# Patient Record
Sex: Female | Born: 1995 | Hispanic: No | Marital: Single | Smoking: Never smoker
Health system: Southern US, Community
[De-identification: ages and names within clinical notes are randomized; demographics above are authoritative.]

## PROBLEM LIST (undated history)

## (undated) DIAGNOSIS — N83209 Unspecified ovarian cyst, unspecified side: Secondary | ICD-10-CM

## (undated) HISTORY — PX: INDUCED ABORTION: SHX677

---

## 2017-11-28 ENCOUNTER — Encounter: Payer: Self-pay | Admitting: Emergency Medicine

## 2017-11-28 ENCOUNTER — Emergency Department: Payer: Medicaid - Out of State

## 2017-11-28 ENCOUNTER — Other Ambulatory Visit: Payer: Self-pay

## 2017-11-28 ENCOUNTER — Emergency Department
Admission: EM | Admit: 2017-11-28 | Discharge: 2017-11-28 | Disposition: A | Discharge status: Home / Self Care | Payer: Medicaid - Out of State | Attending: Emergency Medicine | Admitting: Emergency Medicine

## 2017-11-28 DIAGNOSIS — N83291 Other ovarian cyst, right side: Secondary | ICD-10-CM | POA: Diagnosis not present

## 2017-11-28 DIAGNOSIS — N941 Unspecified dyspareunia: Secondary | ICD-10-CM

## 2017-11-28 DIAGNOSIS — R1032 Left lower quadrant pain: Secondary | ICD-10-CM | POA: Diagnosis present

## 2017-11-28 DIAGNOSIS — N83201 Unspecified ovarian cyst, right side: Secondary | ICD-10-CM

## 2017-11-28 DIAGNOSIS — N9419 Other specified dyspareunia: Secondary | ICD-10-CM | POA: Diagnosis not present

## 2017-11-28 DIAGNOSIS — R102 Pelvic and perineal pain: Secondary | ICD-10-CM | POA: Diagnosis not present

## 2017-11-28 LAB — BASIC METABOLIC PANEL
ANION GAP: 4 — AB (ref 5–15)
BUN: 14 mg/dL (ref 6–20)
CALCIUM: 8.7 mg/dL — AB (ref 8.9–10.3)
CO2: 26 mmol/L (ref 22–32)
CREATININE: 0.6 mg/dL (ref 0.44–1.00)
Chloride: 107 mmol/L (ref 101–111)
GFR calc Af Amer: >60 mL/min (ref >60)
GLUCOSE: 91 mg/dL (ref 65–99)
Potassium: 3.7 mmol/L (ref 3.5–5.1)
Sodium: 137 mmol/L (ref 135–145)

## 2017-11-28 LAB — URINALYSIS, COMPLETE (UACMP) WITH MICROSCOPIC
Bacteria, UA: NONE SEEN
Bilirubin Urine: NEGATIVE
GLUCOSE, UA: NEGATIVE mg/dL
HGB URINE DIPSTICK: NEGATIVE
Ketones, ur: NEGATIVE mg/dL
Leukocytes, UA: NEGATIVE
NITRITE: NEGATIVE
PH: 7 (ref 5.0–8.0)
Protein, ur: NEGATIVE mg/dL
SPECIFIC GRAVITY, URINE: 1.019 (ref 1.005–1.030)

## 2017-11-28 LAB — CBC
HCT: 37.3 % (ref 35.0–47.0)
HEMOGLOBIN: 12.8 g/dL (ref 12.0–16.0)
MCH: 31.5 pg (ref 26.0–34.0)
MCHC: 34.4 g/dL (ref 32.0–36.0)
MCV: 91.4 fL (ref 80.0–100.0)
PLATELETS: 255 10*3/uL (ref 150–440)
RBC: 4.08 MIL/uL (ref 3.80–5.20)
RDW: 13 % (ref 11.5–14.5)
WBC: 6.1 10*3/uL (ref 3.6–11.0)

## 2017-11-28 LAB — POCT PREGNANCY, URINE: Preg Test, Ur: NEGATIVE

## 2017-11-28 MED ORDER — KETOROLAC TROMETHAMINE 10 MG PO TABS
10.0000 mg | ORAL_TABLET | Freq: Once | ORAL | Status: AC
  Administered 2017-11-28: 10 mg via ORAL
  Filled 2017-11-28: qty 1

## 2017-11-28 MED ORDER — IBUPROFEN 800 MG PO TABS: 800.0000 mg | ORAL_TABLET | Freq: Three times a day (TID) | ORAL | 0 refills | Status: DC | PRN

## 2017-11-28 NOTE — ED Triage Notes (Signed)
Here for LLQ pain on and off X 1 year.  Pt reports her doctor before told her because her ex had a large penis but having pain with periods and intermittent without bleeding as well.  VSS. NAD

## 2017-11-28 NOTE — ED Provider Notes (Signed)
[facility] Emergency Department Provider Note  ____________________________________________  Time seen: Approximately 3:20 PM  I have reviewed the triage vital signs and the nursing notes.   HISTORY  Chief Complaint Abdominal Pain    HPI Dorothy Ingram is a 22 y.o. female who complains of left lower quadrant abdominal pain on and off for the past year. Worse after sexual intercourse. She's had multiple evaluations for this, but no recent imaging. She went to the health department yesterday where she had a pelvic exam and STI evaluation completed. She has no concern for STI at this time. However, this chronic recurrent pain has been worse for the past 4 days, started after sexual intercourse, constant, waxing and waning, severe, no aggravating or alleviating factors.  denies unusual vaginal bleeding or discharge.    History reviewed. No pertinent past medical history.   There are no active problems to display for this patient.    Past Surgical History:  Procedure Laterality Date  . INDUCED ABORTION       Prior to Admission medications   Medication Sig Start Date End Date Taking? Authorizing Provider  ibuprofen (ADVIL,MOTRIN) 800 MG tablet Take 1 tablet (800 mg total) by mouth every 8 (eight) hours as needed. 11/28/17   Sharman CheekStafford, Kristle Wesch, MD  none   Allergies Codeine and Latex   History reviewed. No pertinent family history.  Social History Social History   Tobacco Use  . Smoking status: Never Smoker  . Smokeless tobacco: Never Used  Substance Use Topics  . Alcohol use: Never    Frequency: Never  . Drug use: Never    Review of Systems  Constitutional:   No fever or chills.   Cardiovascular:   No chest pain or syncope. Respiratory:   No dyspnea or cough. Gastrointestinal:  positive as above for abdominal pain without vomiting and diarrhea.  Musculoskeletal:   Negative for focal pain or swelling. No joint pains All other  systems reviewed and are negative except as documented above in ROS and HPI.  ____________________________________________   PHYSICAL EXAM:  VITAL SIGNS: ED Triage Vitals  Enc Vitals Group     BP 11/28/17 1043 121/62     Pulse Rate 11/28/17 1043 (!) 59     Resp 11/28/17 1043 16     Temp 11/28/17 1043 99.6 F (37.6 C)     Temp Source 11/28/17 1043 Oral     SpO2 11/28/17 1043 95 %     Weight 11/28/17 1045 144 lb (65.3 kg)     Height 11/28/17 1045 5\' 5"  (1.651 m)     Head Circumference --      Peak Flow --      Pain Score 11/28/17 1055 5     Pain Loc --      Pain Edu? --      Excl. in GC? --     Vital signs reviewed, nursing assessments reviewed.   Constitutional:   Alert and oriented. Well appearing and in no distress. Eyes:   Conjunctivae are normal. EOMI.  ENT      Head:   Normocephalic and atraumatic.      Nose:   No congestion/rhinnorhea.       Mouth/Throat:   MMM, no pharyngeal erythema. No peritonsillar mass.       Neck:   No meningismus. Full ROM. Hematological/Lymphatic/Immunilogical:   No cervical lymphadenopathy. Cardiovascular:   RRR. Symmetric bilateral radial and DP pulses.  No murmurs.  Respiratory:   Normal respiratory effort without tachypnea/retractions.  Breath sounds are clear and equal bilaterally. No wheezes/rales/rhonchi. Gastrointestinal:   Soft with suprapubic and left lower quadrant tenderness. Non distended. There is no CVA tenderness.  No rebound, rigidity, or guarding. Genitourinary:   deferred Musculoskeletal:   Normal range of motion in all extremities. No joint effusions.  No lower extremity tenderness.  No edema. Neurologic:   Normal speech and language.  Motor grossly intact. No acute focal neurologic deficits are appreciated.    ____________________________________________    LABS (pertinent positives/negatives) (all labs ordered are listed, but only abnormal results are displayed) Labs Reviewed  BASIC METABOLIC PANEL - Abnormal;  Notable for the following components:      Result Value   Calcium 8.7 (*)    Anion gap 4 (*)    All other components within normal limits  URINALYSIS, COMPLETE (UACMP) WITH MICROSCOPIC - Abnormal; Notable for the following components:   Color, Urine YELLOW (*)    APPearance CLEAR (*)    Squamous Epithelial / LPF 0-5 (*)    All other components within normal limits  CBC  POCT PREGNANCY, URINE  POC URINE PREG, ED   ____________________________________________   EKG    ____________________________________________    RADIOLOGY  US Transvaginal Non-ob  Result Date: 11/28/2017 CLINICAL DATA:  Intermittent left lower quadrant pain for 1 year EXAM: TRANSABDOMINAL AND TRANSVAGINAL ULTRASOUND OF PELVIS DOPPLER ULTRASOUND OF OVARIES TECHNIQUE: Both transabdominal and transvaginal ultrasound examinations of the pelvis were performed. Transabdominal technique was performed for global imaging of the pelvis including uterus, ovaries, adnexal regions, and pelvic cul-de-sac. It was necessary to proceed with endovaginal exam following the transabdominal exam to visualize the endometrium and ovaries. Color and duplex Doppler ultrasound was utilized to evaluate blood flow to the ovaries. COMPARISON:  None. FINDINGS: Uterus Measurements: 8.9 x 4.2 x 5.5 cm. No fibroids or other mass visualized. Endometrium Thickness: 3 mm.  No focal abnormality visualized. Right ovary Measurements: 4.9 x 2.5 x 3.8 cm.  Cyst measuring 3.6 x 2 x 3.1 cm Left ovary Measurements: 3.4 x 2.1 x 3 cm.  Normal appearance/no adnexal mass. Pulsed Doppler evaluation of both ovaries demonstrates normal low-resistance arterial and venous waveforms. Other findings Small free fluid in the pelvis. IMPRESSION: 1. Negative for ovarian torsion 2. 3.6 cm right ovarian cyst. This is almost certainly benign, and no specific imaging follow up is recommended according to the Society of Radiologists in Ultrasound2010 Consensus Conference Statement (D  Lenis Noon et al. Management of Asymptomatic Ovarian and Other Adnexal Cysts Imaged at [place]: Society of Radiologists in Ultrasound Consensus Conference Statement 2010. Radiology 256 (Sept 2010): 943-954.). 3. Small amount of free fluid in the pelvis. Electronically Signed   By: Jasmine Pang M.D.   On: 11/28/2017 16:32   US Pelvis Complete  Result Date: 11/28/2017 CLINICAL DATA:  Intermittent left lower quadrant pain for 1 year EXAM: TRANSABDOMINAL AND TRANSVAGINAL ULTRASOUND OF PELVIS DOPPLER ULTRASOUND OF OVARIES TECHNIQUE: Both transabdominal and transvaginal ultrasound examinations of the pelvis were performed. Transabdominal technique was performed for global imaging of the pelvis including uterus, ovaries, adnexal regions, and pelvic cul-de-sac. It was necessary to proceed with endovaginal exam following the transabdominal exam to visualize the endometrium and ovaries. Color and duplex Doppler ultrasound was utilized to evaluate blood flow to the ovaries. COMPARISON:  None. FINDINGS: Uterus Measurements: 8.9 x 4.2 x 5.5 cm. No fibroids or other mass visualized. Endometrium Thickness: 3 mm.  No focal abnormality visualized. Right ovary Measurements: 4.9 x 2.5 x 3.8 cm.  Cyst measuring  3.6 x 2 x 3.1 cm Left ovary Measurements: 3.4 x 2.1 x 3 cm.  Normal appearance/no adnexal mass. Pulsed Doppler evaluation of both ovaries demonstrates normal low-resistance arterial and venous waveforms. Other findings Small free fluid in the pelvis. IMPRESSION: 1. Negative for ovarian torsion 2. 3.6 cm right ovarian cyst. This is almost certainly benign, and no specific imaging follow up is recommended according to the Society of Radiologists in Ultrasound2010 Consensus Conference Statement (D Lenis Noon et al. Management of Asymptomatic Ovarian and Other Adnexal Cysts Imaged at [place]: Society of Radiologists in Ultrasound Consensus Conference Statement 2010. Radiology 256 (Sept 2010): [phone].). 3. Small amount of free fluid in the  pelvis. Electronically Signed   By: Jasmine Pang M.D.   On: 11/28/2017 16:32   [place] Art/ven Flow Abd Pelv Doppler  Result Date: 11/28/2017 CLINICAL DATA:  Intermittent left lower quadrant pain for 1 year EXAM: TRANSABDOMINAL AND TRANSVAGINAL ULTRASOUND OF PELVIS DOPPLER ULTRASOUND OF OVARIES TECHNIQUE: Both transabdominal and transvaginal ultrasound examinations of the pelvis were performed. Transabdominal technique was performed for global imaging of the pelvis including uterus, ovaries, adnexal regions, and pelvic cul-de-sac. It was necessary to proceed with endovaginal exam following the transabdominal exam to visualize the endometrium and ovaries. Color and duplex Doppler ultrasound was utilized to evaluate blood flow to the ovaries. COMPARISON:  None. FINDINGS: Uterus Measurements: 8.9 x 4.2 x 5.5 cm. No fibroids or other mass visualized. Endometrium Thickness: 3 mm.  No focal abnormality visualized. Right ovary Measurements: 4.9 x 2.5 x 3.8 cm.  Cyst measuring 3.6 x 2 x 3.1 cm Left ovary Measurements: 3.4 x 2.1 x 3 cm.  Normal appearance/no adnexal mass. Pulsed Doppler evaluation of both ovaries demonstrates normal low-resistance arterial and venous waveforms. Other findings Small free fluid in the pelvis. IMPRESSION: 1. Negative for ovarian torsion 2. 3.6 cm right ovarian cyst. This is almost certainly benign, and no specific imaging follow up is recommended according to the Society of Radiologists in Ultrasound2010 Consensus Conference Statement (D Lenis Noon et al. Management of Asymptomatic Ovarian and Other Adnexal Cysts Imaged at [place]: Society of Radiologists in Ultrasound Consensus Conference Statement 2010. Radiology 256 (Sept 2010): [phone].). 3. Small amount of free fluid in the pelvis. Electronically Signed   By: Jasmine Pang M.D.   On: 11/28/2017 16:32    ____________________________________________   PROCEDURES Procedures  ____________________________________________  DIFFERENTIAL  DIAGNOSIS   ovarian cysts, ovarian torsion, TOA, endometriosis, dyspareunia  CLINICAL IMPRESSION / ASSESSMENT AND PLAN / ED COURSE  Pertinent labs & imaging results that were available during my care of the patient were reviewed by me and considered in my medical decision making (see chart for details).    patient well appearing no acute distress normal vital signs, calm and comfortable and pleasantly interactive. Has recurrent chronic abdominal pain that is more severe than usual. I'll get an ultrasound to evaluate for ovarian cysts and complications thereof versus TOA. If negative and patient can continue to follow up with gynecology outpatient.  Clinical Course as of Nov 29 1707  Thu Nov 28, 2017  1653 US pelvis unremarkable except 3 cm R ovarian cyst. No torsion, no TOA. Plan for outpatient follow up.    [PS]    Clinical Course User Index [PS] Sharman Cheek, MD    ----------------------------------------- 5:09 PM on 11/28/2017 -----------------------------------------  Patient calm and comfortable. Ultrasound negative except for incidental right ovarian cyst which is small. Patient informed of findings. Follow-up plan for gynecology. Referral information provided.purple but provided.  ____________________________________________   FINAL CLINICAL IMPRESSION(S) / ED DIAGNOSES    Final diagnoses:  Pelvic pain in female  Dyspareunia, female  Right ovarian cyst     ED Discharge Orders        Ordered    ibuprofen (ADVIL,MOTRIN) 800 MG tablet  Every 8 hours PRN     11/28/17 1708      Portions of this note were generated with dragon dictation software. Dictation errors may occur despite best attempts at proofreading.    Sharman Cheek, MD 11/28/17 [phone]

## 2017-11-28 NOTE — ED Notes (Addendum)
Pt ambulatory to POV. VSS. NAD. Discharge instructions reviewed.

## 2018-01-24 ENCOUNTER — Other Ambulatory Visit: Payer: Self-pay

## 2018-01-24 ENCOUNTER — Encounter (HOSPITAL_BASED_OUTPATIENT_CLINIC_OR_DEPARTMENT_OTHER): Payer: Self-pay | Admitting: *Deleted

## 2018-01-24 ENCOUNTER — Emergency Department (HOSPITAL_BASED_OUTPATIENT_CLINIC_OR_DEPARTMENT_OTHER): Payer: No Typology Code available for payment source

## 2018-01-24 ENCOUNTER — Emergency Department (HOSPITAL_BASED_OUTPATIENT_CLINIC_OR_DEPARTMENT_OTHER)
Admission: EM | Admit: 2018-01-24 | Discharge: 2018-01-24 | Disposition: A | Discharge status: Home / Self Care | Payer: No Typology Code available for payment source | Attending: Emergency Medicine | Admitting: Emergency Medicine

## 2018-01-24 DIAGNOSIS — S299XXA Unspecified injury of thorax, initial encounter: Secondary | ICD-10-CM | POA: Diagnosis not present

## 2018-01-24 DIAGNOSIS — Y999 Unspecified external cause status: Secondary | ICD-10-CM | POA: Insufficient documentation

## 2018-01-24 DIAGNOSIS — S199XXA Unspecified injury of neck, initial encounter: Secondary | ICD-10-CM | POA: Diagnosis not present

## 2018-01-24 DIAGNOSIS — S3991XA Unspecified injury of abdomen, initial encounter: Secondary | ICD-10-CM | POA: Insufficient documentation

## 2018-01-24 DIAGNOSIS — Y9389 Activity, other specified: Secondary | ICD-10-CM | POA: Diagnosis not present

## 2018-01-24 DIAGNOSIS — Y92410 Unspecified street and highway as the place of occurrence of the external cause: Secondary | ICD-10-CM | POA: Diagnosis not present

## 2018-01-24 DIAGNOSIS — S0990XA Unspecified injury of head, initial encounter: Secondary | ICD-10-CM | POA: Diagnosis present

## 2018-01-24 HISTORY — DX: Unspecified ovarian cyst, unspecified side: N83.209

## 2018-01-24 LAB — CBC WITH DIFFERENTIAL/PLATELET
BASOS ABS: 0 10*3/uL (ref 0.0–0.1)
Basophils Relative: 0 %
EOS ABS: 0 10*3/uL (ref 0.0–0.7)
Eosinophils Relative: 1 %
HCT: 36.9 % (ref 36.0–46.0)
Hemoglobin: 12.9 g/dL (ref 12.0–15.0)
LYMPHS ABS: 2.4 10*3/uL (ref 0.7–4.0)
LYMPHS PCT: 28 %
MCH: 31.7 pg (ref 26.0–34.0)
MCHC: 35 g/dL (ref 30.0–36.0)
MCV: 90.7 fL (ref 78.0–100.0)
MONO ABS: 0.7 10*3/uL (ref 0.1–1.0)
Monocytes Relative: 9 %
Neutro Abs: 5.3 10*3/uL (ref 1.7–7.7)
Neutrophils Relative %: 62 %
Platelets: 283 10*3/uL (ref 150–400)
RBC: 4.07 MIL/uL (ref 3.87–5.11)
RDW: 11.7 % (ref 11.5–15.5)
WBC: 8.5 10*3/uL (ref 4.0–10.5)

## 2018-01-24 LAB — PREGNANCY, URINE: PREG TEST UR: NEGATIVE

## 2018-01-24 LAB — BASIC METABOLIC PANEL
Anion gap: 7 (ref 5–15)
BUN: 15 mg/dL (ref 6–20)
CALCIUM: 9.1 mg/dL (ref 8.9–10.3)
CO2: 26 mmol/L (ref 22–32)
CREATININE: 0.63 mg/dL (ref 0.44–1.00)
Chloride: 106 mmol/L (ref 101–111)
GFR calc Af Amer: >60 mL/min (ref >60)
GLUCOSE: 90 mg/dL (ref 65–99)
Potassium: 3.5 mmol/L (ref 3.5–5.1)
Sodium: 139 mmol/L (ref 135–145)

## 2018-01-24 MED ORDER — IOPAMIDOL (ISOVUE-300) INJECTION 61%
100.0000 mL | Freq: Once | INTRAVENOUS | Status: AC | PRN
  Administered 2018-01-24: 100 mL via INTRAVENOUS

## 2018-01-24 NOTE — ED Notes (Signed)
Ct waiting for urine pregnancy to result prior to imaging abd/pelvis with iv contrast per radiology protocol
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
## 2018-03-07 ENCOUNTER — Emergency Department (HOSPITAL_COMMUNITY)
Admission: EM | Admit: 2018-03-07 | Discharge: 2018-03-07 | Disposition: A | Discharge status: Home / Self Care | Payer: Self-pay | Attending: Emergency Medicine | Admitting: Emergency Medicine

## 2018-03-07 ENCOUNTER — Other Ambulatory Visit: Payer: Self-pay

## 2018-03-07 ENCOUNTER — Emergency Department (HOSPITAL_COMMUNITY): Payer: Self-pay

## 2018-03-07 ENCOUNTER — Encounter (HOSPITAL_COMMUNITY): Payer: Self-pay | Admitting: *Deleted

## 2018-03-07 DIAGNOSIS — N83299 Other ovarian cyst, unspecified side: Secondary | ICD-10-CM

## 2018-03-07 DIAGNOSIS — Z91040 Latex allergy status: Secondary | ICD-10-CM | POA: Insufficient documentation

## 2018-03-07 DIAGNOSIS — R102 Pelvic and perineal pain: Secondary | ICD-10-CM | POA: Insufficient documentation

## 2018-03-07 DIAGNOSIS — N83209 Unspecified ovarian cyst, unspecified side: Secondary | ICD-10-CM

## 2018-03-07 DIAGNOSIS — N83201 Unspecified ovarian cyst, right side: Secondary | ICD-10-CM | POA: Insufficient documentation

## 2018-03-07 LAB — COMPREHENSIVE METABOLIC PANEL
ALT: 10 U/L (ref 0–44)
AST: 15 U/L (ref 15–41)
Albumin: 4.2 g/dL (ref 3.5–5.0)
Alkaline Phosphatase: 35 U/L — ABNORMAL LOW (ref 38–126)
Anion gap: 6 (ref 5–15)
BUN: 13 mg/dL (ref 6–20)
CO2: 26 mmol/L (ref 22–32)
Calcium: 9.4 mg/dL (ref 8.9–10.3)
Chloride: 108 mmol/L (ref 98–111)
Creatinine, Ser: 0.63 mg/dL (ref 0.44–1.00)
GFR calc Af Amer: >60 mL/min (ref >60)
GFR calc non Af Amer: >60 mL/min (ref >60)
GLUCOSE: 87 mg/dL (ref 70–99)
POTASSIUM: 3.8 mmol/L (ref 3.5–5.1)
SODIUM: 140 mmol/L (ref 135–145)
TOTAL PROTEIN: 6.8 g/dL (ref 6.5–8.1)
Total Bilirubin: 0.7 mg/dL (ref 0.3–1.2)

## 2018-03-07 LAB — CBC
HEMATOCRIT: 38.7 % (ref 36.0–46.0)
Hemoglobin: 12.7 g/dL (ref 12.0–15.0)
MCH: 30.7 pg (ref 26.0–34.0)
MCHC: 32.8 g/dL (ref 30.0–36.0)
MCV: 93.5 fL (ref 78.0–100.0)
Platelets: 265 10*3/uL (ref 150–400)
RBC: 4.14 MIL/uL (ref 3.87–5.11)
RDW: 11.7 % (ref 11.5–15.5)
WBC: 9 10*3/uL (ref 4.0–10.5)

## 2018-03-07 LAB — URINALYSIS, ROUTINE W REFLEX MICROSCOPIC
BILIRUBIN URINE: NEGATIVE
GLUCOSE, UA: NEGATIVE mg/dL
HGB URINE DIPSTICK: NEGATIVE
KETONES UR: NEGATIVE mg/dL
Leukocytes, UA: NEGATIVE
NITRITE: NEGATIVE
Protein, ur: NEGATIVE mg/dL
Specific Gravity, Urine: 1.014 (ref 1.005–1.030)
pH: 6 (ref 5.0–8.0)

## 2018-03-07 LAB — I-STAT BETA HCG BLOOD, ED (MC, WL, AP ONLY): I-stat hCG, quantitative: <5 m[IU]/mL (ref <5)

## 2018-03-07 LAB — LIPASE, BLOOD: LIPASE: 41 U/L (ref 11–51)

## 2018-03-07 LAB — WET PREP, GENITAL
Clue Cells Wet Prep HPF POC: NONE SEEN
SPERM: NONE SEEN
Trich, Wet Prep: NONE SEEN
Yeast Wet Prep HPF POC: NONE SEEN

## 2018-03-07 NOTE — ED Triage Notes (Signed)
PT reports  RLQ ABD pain . Pt reports a previous Ovarian  Cyst . Pt reports this pain feels like  The cyst pain . Pt also is not using  Birth Control.

## 2018-03-07 NOTE — Care Management (Signed)
ED CM received consult from M. McDonald PA-C on Pod E concerning patient needling close follow up. Patient does not have a PCP or health insurance. Patient can contact [facility] on Monday 7/22 to schedule a follow up appointment. CM notified clinic CM concerning patient. Updated EDP, who is agreeable with transitional care plan. No further ED CM needs.

## 2018-03-07 NOTE — ED Notes (Signed)
Patient verbalizes understanding of discharge instructions. Opportunity for questioning and answers were provided. Armband removed by staff, pt discharged from ED.  

## 2018-03-07 NOTE — Discharge Instructions (Signed)
Thank you for allowing me to care for you today in the Emergency Department.   Your ultrasound today showed a complex cyst to the right ovary.  The radiologist recommended a repeat ultrasound in 6 to 12 weeks.  They recommended following a "normal menstrual cycle", but as we discussed, you have not been having regular periods. If you are able to follow up with an OB/GYN, they may be able to guide you as to when you should get the ultrasound completed since her periods have been irregular.  I will place a call with case management and have them follow up with you to see if they can assist you with changing your Medicaid.  You can also follow-up with your case manager work.  You can take your home ibuprofen if your pain worsens.  I suspect her pain was worse today because 1 of the smaller follicles may have ruptured.  Return to the emergency department if you develop fevers, chills, vaginal bleeding with severe right-sided pain that is heavy, shortness of breath, fatigue, change in the vaginal discharge that you have been having, or other new, concerning symptoms.
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
## 2018-03-07 NOTE — ED Notes (Signed)
Patient transported to Ultrasound 

## 2018-03-10 LAB — GC/CHLAMYDIA PROBE AMP (~~LOC~~) NOT AT ARMC
Chlamydia: NEGATIVE
Neisseria Gonorrhea: NEGATIVE

## 2019-02-13 ENCOUNTER — Other Ambulatory Visit: Payer: Self-pay | Admitting: Family

## 2019-02-13 DIAGNOSIS — R1030 Lower abdominal pain, unspecified: Secondary | ICD-10-CM

## 2019-02-13 DIAGNOSIS — R102 Pelvic and perineal pain: Secondary | ICD-10-CM

## 2019-02-16 ENCOUNTER — Ambulatory Visit
Admission: RE | Admit: 2019-02-16 | Discharge: 2019-02-16 | Disposition: A | Discharge status: Home / Self Care | Payer: Medicaid Other | Source: Ambulatory Visit | Attending: Family | Admitting: Family

## 2019-02-16 ENCOUNTER — Telehealth: Payer: Self-pay | Admitting: *Deleted

## 2019-02-16 ENCOUNTER — Other Ambulatory Visit: Payer: Medicaid Other

## 2019-02-16 ENCOUNTER — Other Ambulatory Visit: Payer: Self-pay

## 2019-02-16 DIAGNOSIS — R102 Pelvic and perineal pain: Secondary | ICD-10-CM | POA: Diagnosis not present

## 2019-02-16 DIAGNOSIS — Z20822 Contact with and (suspected) exposure to covid-19: Secondary | ICD-10-CM

## 2019-02-16 DIAGNOSIS — R1030 Lower abdominal pain, unspecified: Secondary | ICD-10-CM | POA: Insufficient documentation

## 2019-02-16 NOTE — Telephone Encounter (Signed)
[facility]: Georgian Co, DNP  Phone: [phone] [phone]  Exposure

## 2019-02-18 ENCOUNTER — Telehealth: Payer: Self-pay | Admitting: *Deleted

## 2019-02-19 LAB — NOVEL CORONAVIRUS, NAA: SARS-CoV-2, NAA: NOT DETECTED

## 2019-02-19 NOTE — Telephone Encounter (Signed)
Opened in error
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
## 2020-02-21 IMAGING — US US TRANSVAGINAL NON-OB
1 series · 13 of 25 positions shown · non-contrast
Comparison: Pelvic ultrasound November 28, 2017

CLINICAL DATA: Right-sided pelvic pain

EXAM:
TRANSABDOMINAL AND TRANSVAGINAL ULTRASOUND OF PELVIS
DOPPLER ULTRASOUND OF OVARIES
TECHNIQUE: Study was performed transabdominally to optimize pelvic field of
view evaluation and transvaginally to optimize internal visceral
architecture evaluation.
Color and duplex Doppler ultrasound was utilized to evaluate blood
flow to the ovaries.

[Series 1: us transvaginal non-ob · 0.21mm/px · 80 acquisitions, 13 frames shown]
[im 1/80]
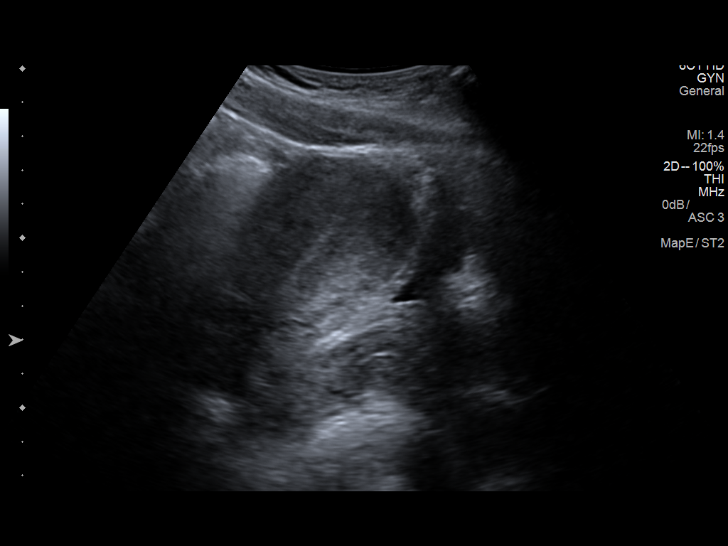
[im 7/80]
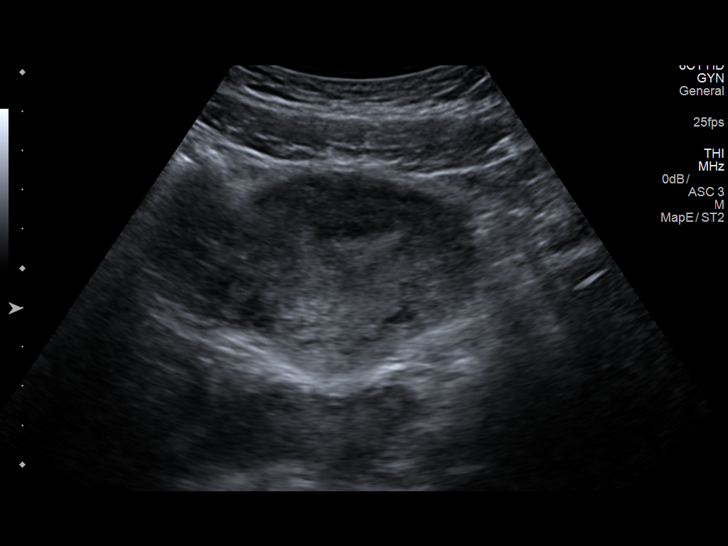
[im 14/80]
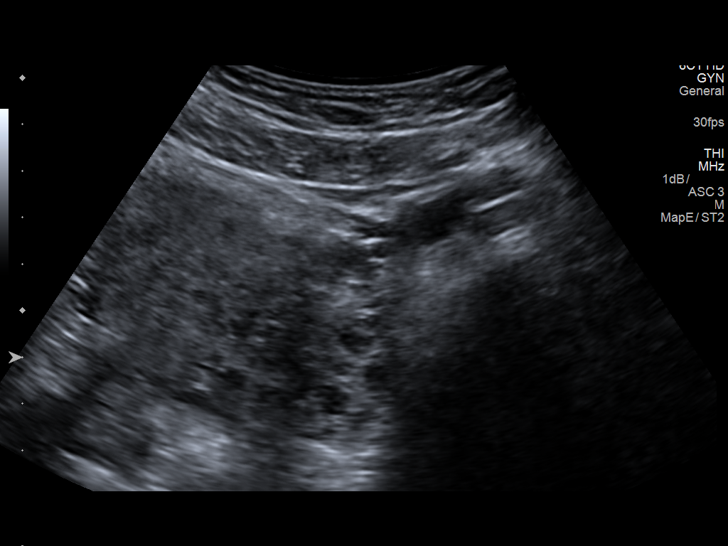
[im 20/80]
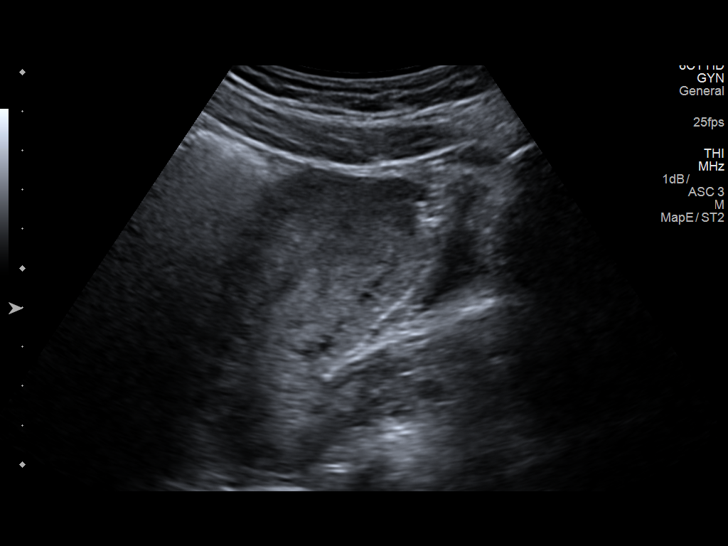
[im 27/80]
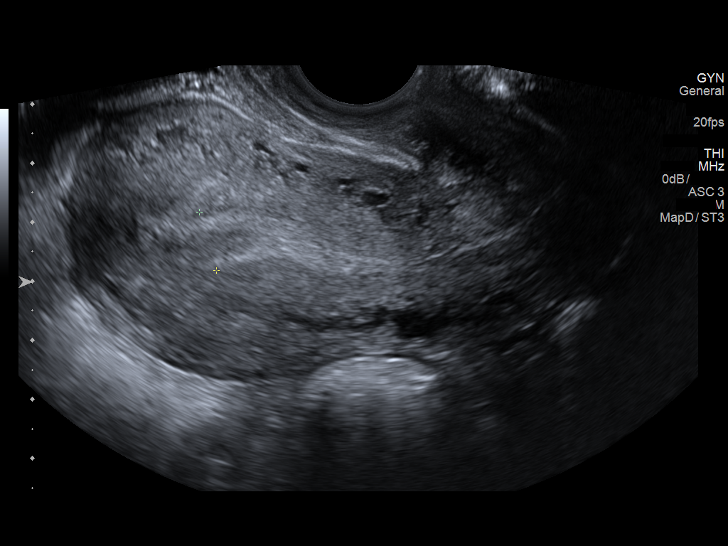
[im 33/80]
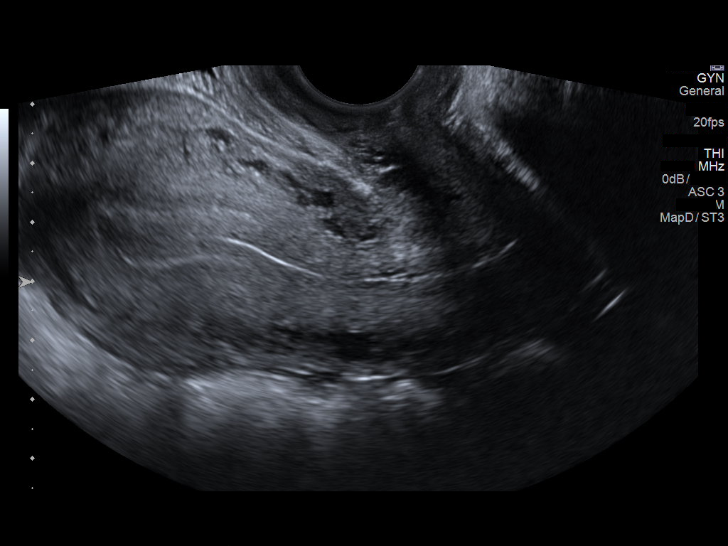
[im 40/80]
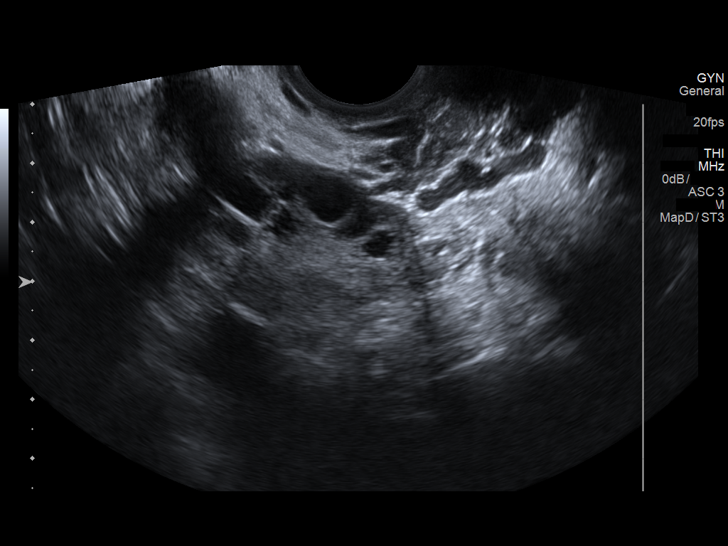
[im 47/80]
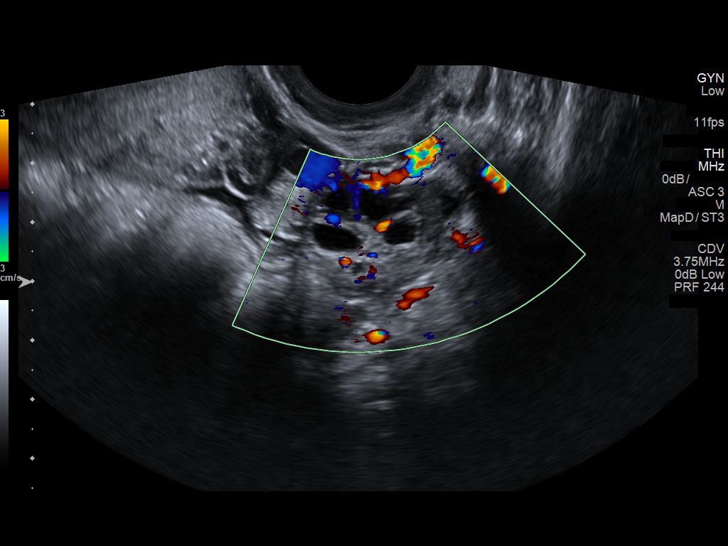
[im 53/80]
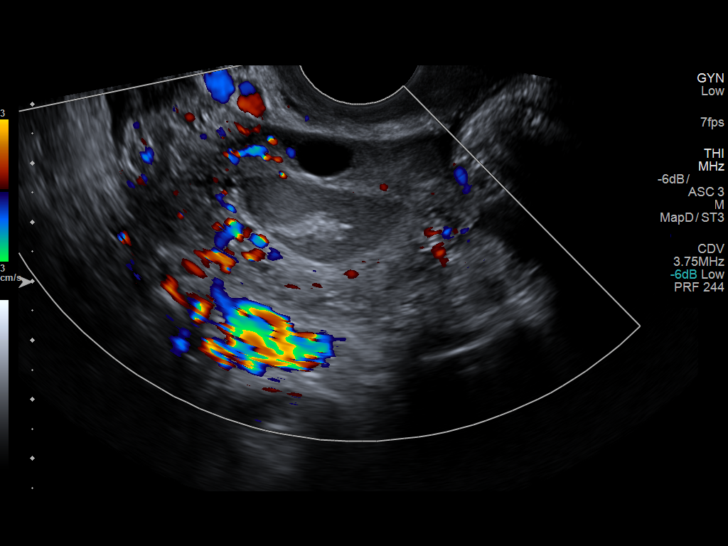
[im 60/80]
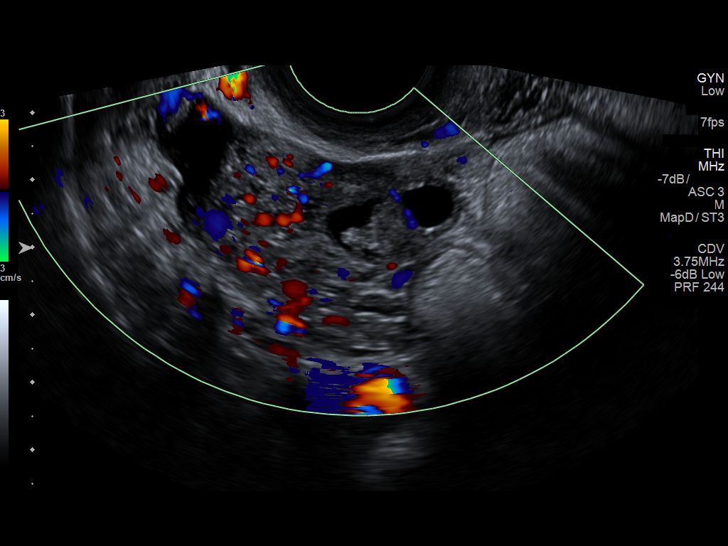
[im 66/80]
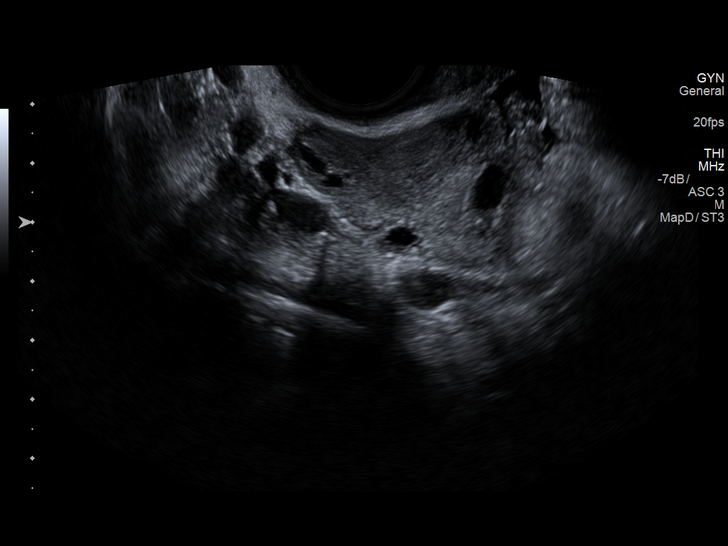
[im 73/80]
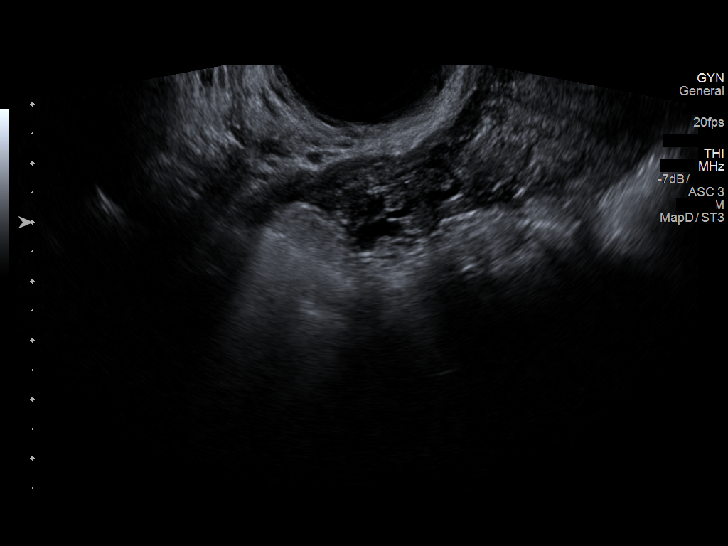
[im 80/80]
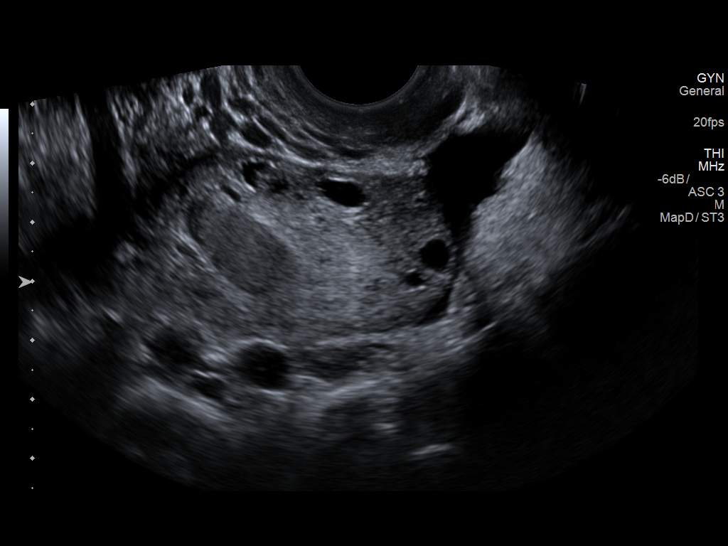

[13 of 25 positions shown; findings below may reference images not displayed]

FINDINGS: Uterus

Measurements: 8.5 x 4.6 x 5.2 cm. No fibroids or other mass
visualized.

Endometrium

Thickness: 10 mm.  No focal abnormality visualized.

Right ovary

Measurements: 5.1 x 3.2 x 4.0 cm. There is a complex partially
cystic lesion in the right ovary measuring 3.0 x 1.6 x 2.8 cm. No
other right-sided pelvic mass evident.

Left ovary

Measurements: 4.3 x 2.6 x 3.4 cm. Normal appearance/no adnexal mass.

Pulsed Doppler evaluation of both ovaries demonstrates normal
low-resistance arterial and venous waveforms.

Other findings

Small amount of free fluid near the right ovary.
IMPRESSION: 1. Complex mass, partially cystic, right ovary. Suspect hemorrhagic
cyst. Short-interval follow up ultrasound in 6-12 weeks is
recommended, preferably during the week following the patient's
normal menses. No other right-sided pelvic mass.

2. Duplex ultrasound shows Doppler flow in both ovaries. No ovarian
enlargement appreciable. No findings which are felt to be indicative
of torsion.

3. Small amount of free fluid near the right ovary which may be
indicative of recent ovarian cyst leakage.

4.  Study otherwise unremarkable.

## 2020-09-23 IMAGING — US US PELVIS COMPLETE WITH TRANSVAGINAL
1 series · 14 of 25 positions shown · non-contrast
Comparison: 03/07/2018

CLINICAL DATA: Left pelvic and lower abdominal pain for 3 weeks.

EXAM:
TRANSABDOMINAL AND TRANSVAGINAL ULTRASOUND OF PELVIS
TECHNIQUE: Both transabdominal and transvaginal ultrasound examinations of the
pelvis were performed. Transabdominal technique was performed for
global imaging of the pelvis including uterus, ovaries, adnexal
regions, and pelvic cul-de-sac. It was necessary to proceed with
endovaginal exam following the transabdominal exam to visualize the
endometrium and ovaries.

[Series 1: us pelvis complete with transvaginal · 0.20mm/px · 14 of 79 slices shown]
[im 1/79]
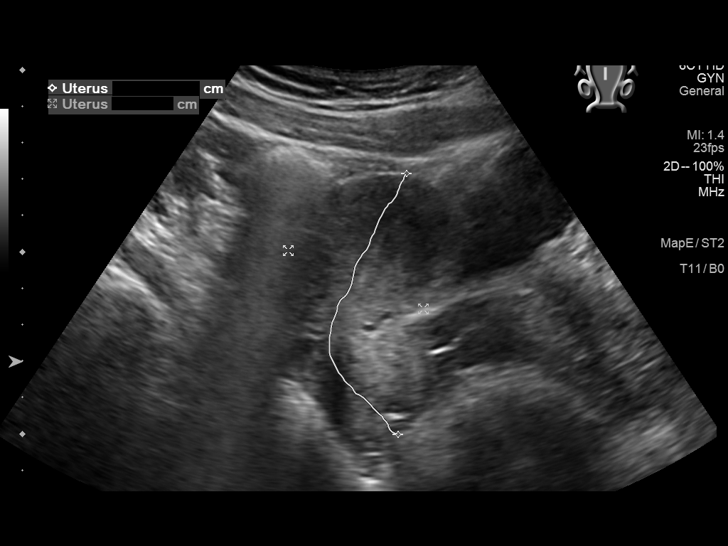
[im 7/79]
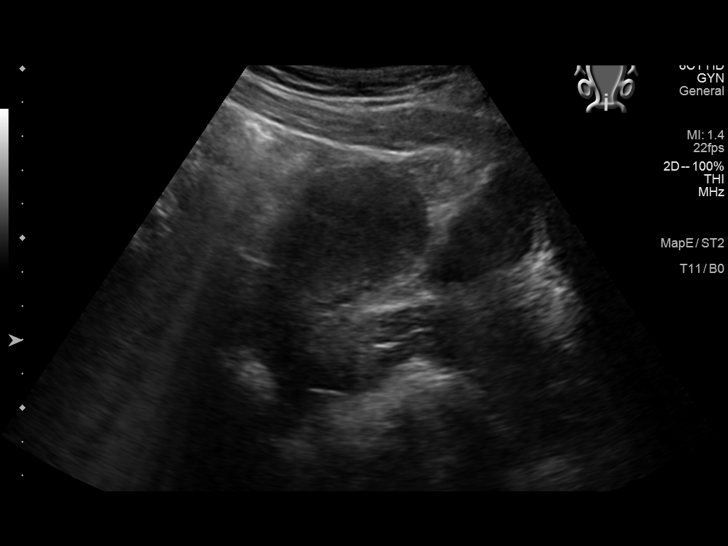
[im 14/79]
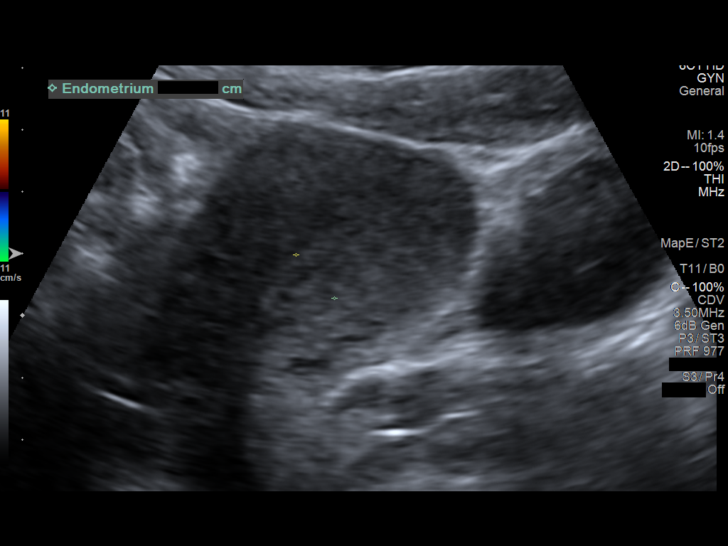
[im 20/79]
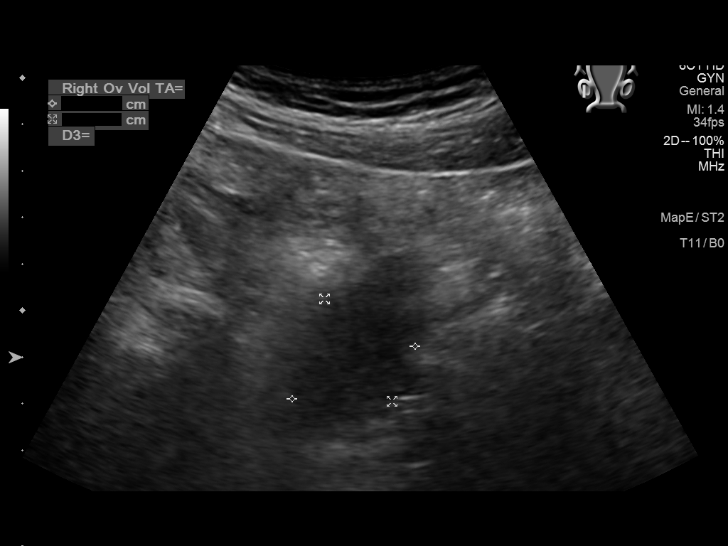
[im 27/79]
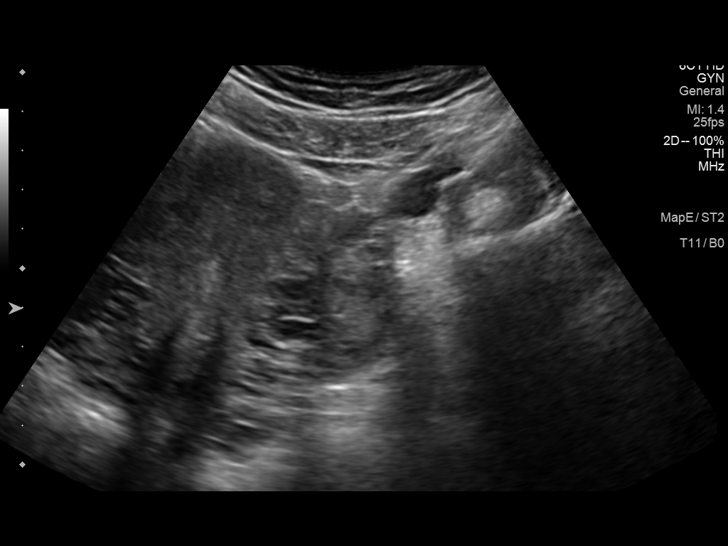
[im 30/79]
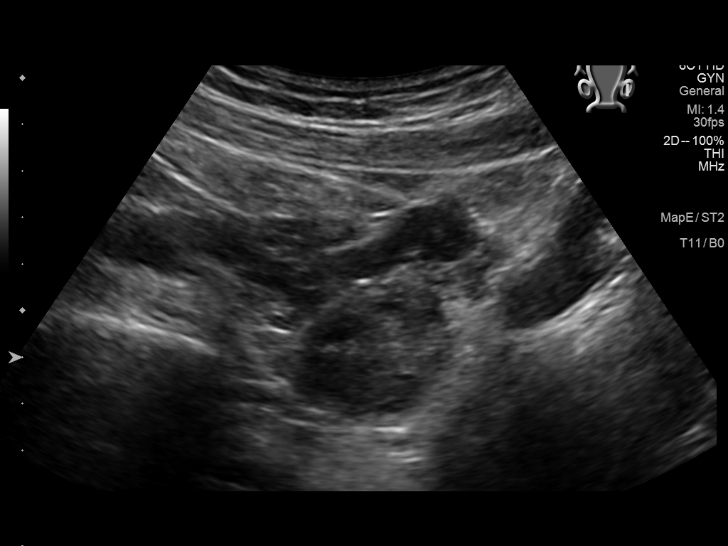
[im 36/79]
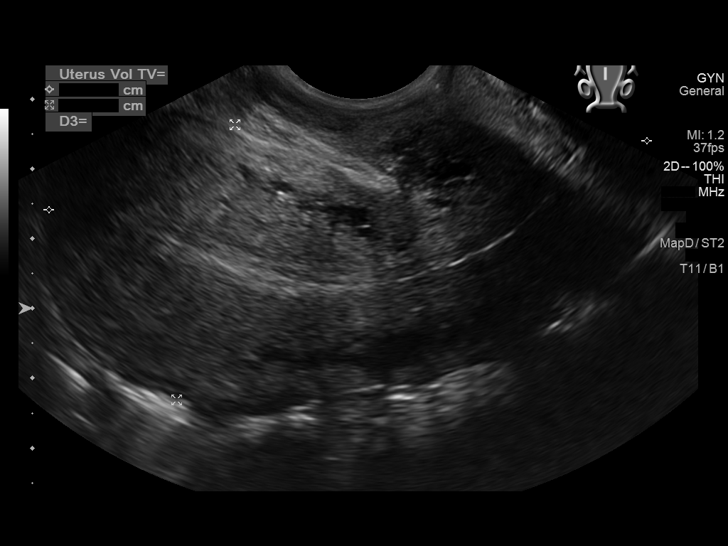
[im 43/79]
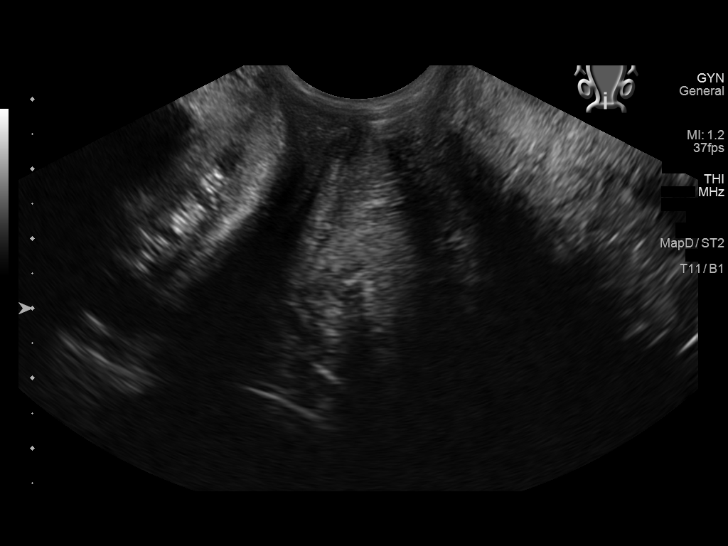
[im 49/79]
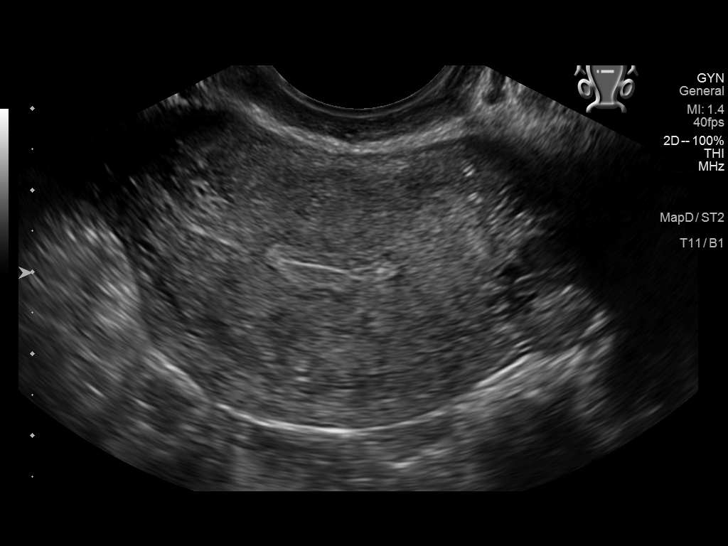
[im 53/79]
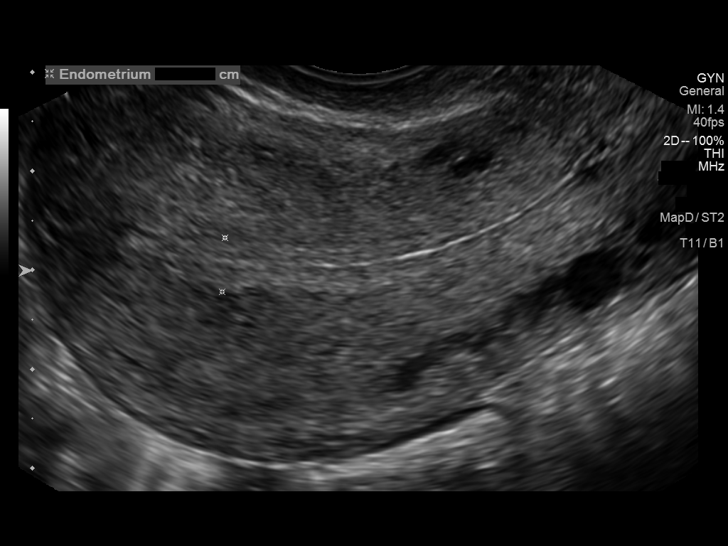
[im 59/79]
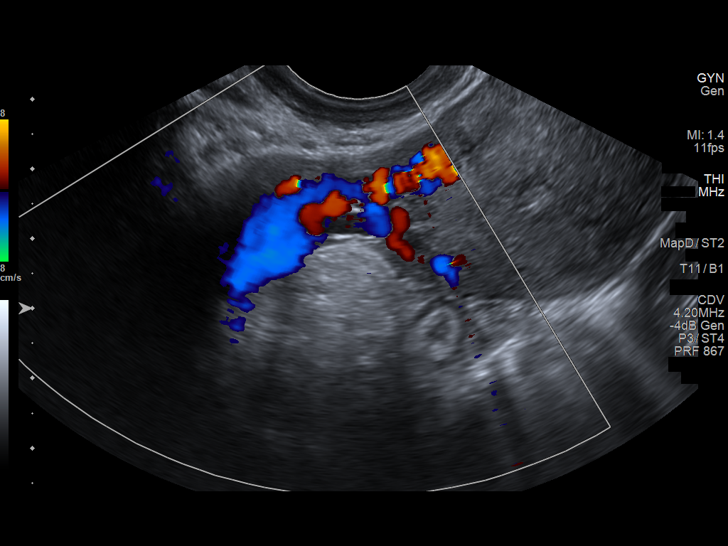
[im 66/79]
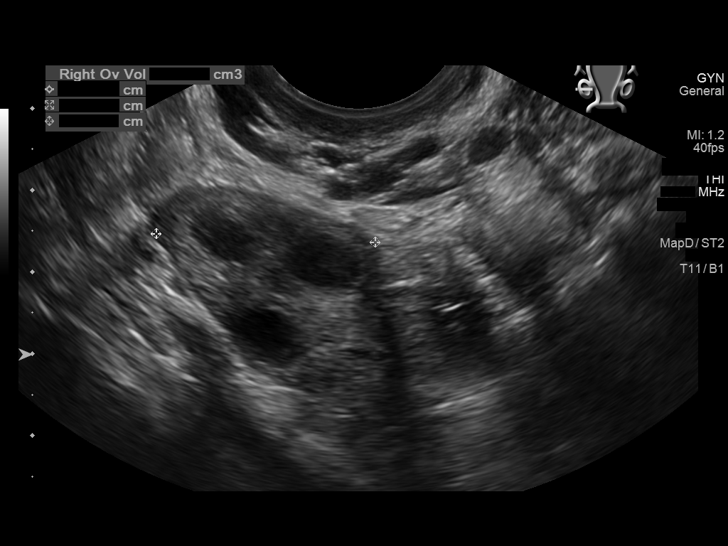
[im 72/79]
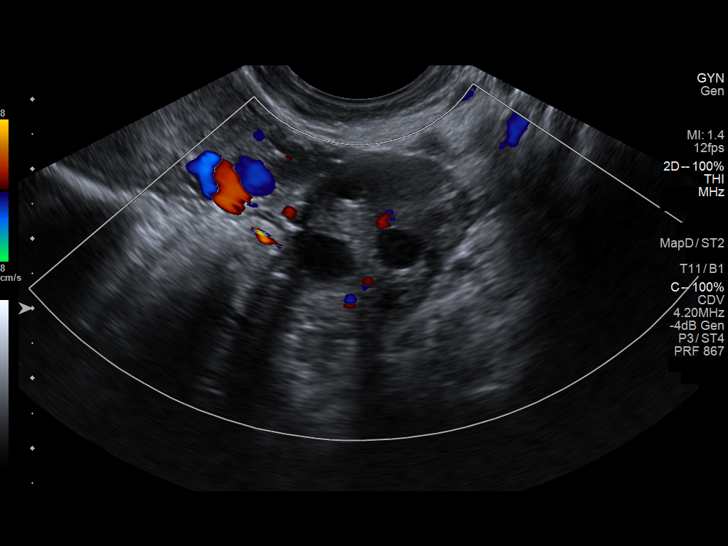
[im 79/79]
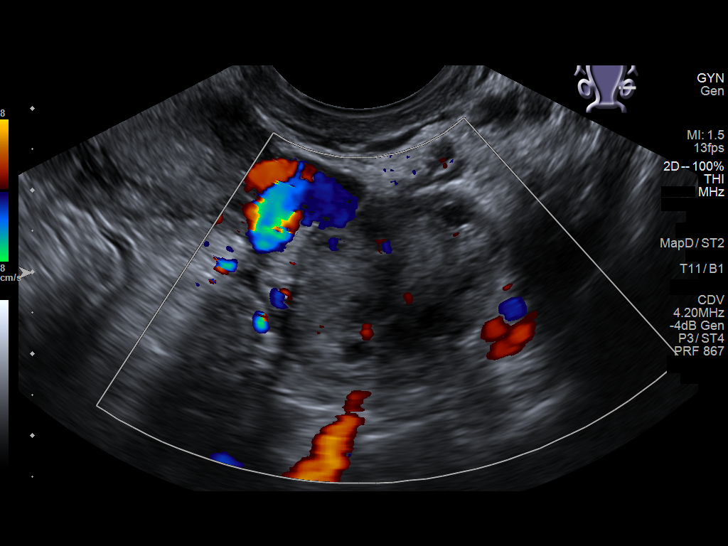

[14 of 25 positions shown; findings below may reference images not displayed]

FINDINGS: Uterus

Measurements: 8.5 x 4.0 x 5.8 cm = volume: 105 mL. No fibroids or
other mass visualized.

Endometrium

Thickness: 6 mm.  No focal abnormality visualized.

Right ovary

Measurements: 4.1 x 2.2 x 2.7 cm = volume: 12.9 mL. Normal
appearance/no adnexal mass.

Left ovary

Measurements: 3.8 x 2.4 x 2.6 cm = volume: 12.1 mL. Normal
appearance/no adnexal mass.

Other findings

No abnormal free fluid.
IMPRESSION: Negative. No pelvic mass or other significant abnormality
identified.
